# Patient Record
Sex: Female | Born: 2005 | Race: White | Hispanic: No | Smoking: Never smoker
Health system: Southern US, Community
[De-identification: ages and names within clinical notes are randomized; demographics above are authoritative.]

## PROBLEM LIST (undated history)

## (undated) DIAGNOSIS — F99 Mental disorder, not otherwise specified: Secondary | ICD-10-CM

## (undated) HISTORY — DX: Mental disorder, not otherwise specified: F99

---
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
## 2023-06-10 ENCOUNTER — Ambulatory Visit (INDEPENDENT_AMBULATORY_CARE_PROVIDER_SITE_OTHER): Payer: Medicaid Other | Admitting: Obstetrics & Gynecology

## 2023-06-10 ENCOUNTER — Encounter: Payer: Self-pay | Admitting: Obstetrics & Gynecology

## 2023-06-10 VITALS — BP 125/76 | HR 88 | Wt 133.8 lb

## 2023-06-10 DIAGNOSIS — Z3202 Encounter for pregnancy test, result negative: Secondary | ICD-10-CM

## 2023-06-10 DIAGNOSIS — Z975 Presence of (intrauterine) contraceptive device: Secondary | ICD-10-CM

## 2023-06-10 DIAGNOSIS — Z30430 Encounter for insertion of intrauterine contraceptive device: Secondary | ICD-10-CM | POA: Diagnosis not present

## 2023-06-10 LAB — POCT URINE PREGNANCY: Preg Test, Ur: NEGATIVE

## 2023-06-10 MED ORDER — LEVONORGESTREL 19.5 MG IU IUD: INTRAUTERINE_SYSTEM | Freq: Once | INTRAUTERINE | Status: AC

## 2023-06-10 NOTE — Progress Notes (Signed)
    GYNECOLOGY OFFICE PROCEDURE NOTE  Felicia Howell is a 17 y.o. G0P0000 here for IUD insertion. No GYN concerns.    Initially desires copper IUD but given history of bleeding issues, was able to counsel her and convince her to try [facility]. Also worried about mood side effects, explained this was the lowest does progestin IUD and less likely to cause mood effects.   IUD Insertion Procedure Note Patient identified, informed consent performed, consent signed.   Discussed risks of irregular bleeding, cramping, infection, malpositioning or misplacement of the IUD outside the uterus which may require further procedure such as laparoscopy. Also discussed >99% contraception efficacy, increased risk of ectopic pregnancy with failure of method.   Emphasized that this did not protect against STIs, condoms recommended during all sexual encounters. Time out was performed.  Chaperone present.  Urine pregnancy test negative.  Speculum placed in the vagina.  Cervix visualized.  Cleaned with Betadine x 2.  Paracervical block with 10 ml of 1 % lidocaine administered.  Cervix was grasped anteriorly with a single tooth tenaculum.  Uterus sounded to 7 cm.  Kyleena IUD placed per manufacturer's recommendations.  Strings trimmed to 3 cm. Tenaculum was removed, good hemostasis noted.  Patient tolerated procedure well.   Patient was given post-procedure instructions.   Patient was also asked to check IUD strings periodically and follow up in 4 weeks for IUD check.   Felicia Collins, MD, FACOG Obstetrician & Gynecologist, [facility] for [employer], [facility]

## 2023-12-28 ENCOUNTER — Ambulatory Visit: Admission: EM | Admit: 2023-12-28 | Discharge: 2023-12-28 | Disposition: A | Discharge status: Home / Self Care

## 2023-12-28 DIAGNOSIS — J029 Acute pharyngitis, unspecified: Secondary | ICD-10-CM | POA: Insufficient documentation

## 2023-12-28 LAB — POCT RAPID STREP A (OFFICE): Rapid Strep A Screen: NEGATIVE

## 2023-12-28 LAB — POCT MONO SCREEN (KUC): Mono, POC: NEGATIVE

## 2023-12-28 NOTE — Discharge Instructions (Signed)
 Strep and mono were negative.  Suspect viral cause to your sore throat.  If symptoms persist or worsen, please follow-up.

## 2023-12-28 NOTE — ED Triage Notes (Signed)
 Pt presents with sore throat x 1 day. Pt denies emesis and diarrhea.

## 2023-12-28 NOTE — ED Provider Notes (Addendum)
 [facility]    CSN: [id number] Arrival date & time: 12/28/23  0808      History   Chief Complaint Chief Complaint  Patient presents with   Sore Throat    HPI Felicia Howell is a 18 y.o. female.   Patient presents with sore throat that started yesterday.  Reports the pain has increased in severity since it started.  Denies any associated nasal congestion, runny nose, cough, fever, body aches, chills.  Denies any known sick contacts.  Denies any exposure to STD.  Is taking Tylenol for symptoms with some improvement.   Sore Throat    Past Medical History:  Diagnosis Date   Mental disorder    Bipolar, depression, anxiety    There are no active problems to display for this patient.   No past surgical history on file.  OB History     Gravida  0   Para  0   Term  0   Preterm  0   AB  0   Living  0      SAB  0   IAB  0   Ectopic  0   Multiple  0   Live Births  0            Home Medications    Prior to Admission medications   Medication Sig Start Date End Date Taking? Authorizing Provider  acetaminophen (TYLENOL) 160 MG/5ML liquid Take by mouth every 4 (four) hours as needed for fever.   Yes [provider]  amoxicillin-clavulanate (AUGMENTIN) 500-125 MG tablet SMARTSIG:1 Tablet(s) By Mouth 11/03/23  Yes [provider]    Family History Family History  Family history unknown: Yes    Social History Social History   Tobacco Use   Smoking status: Never   Smokeless tobacco: Never  Vaping Use   Vaping status: Never Used  Substance Use Topics   Alcohol use: Not Currently   Drug use: Not Currently     Allergies   Patient has no known allergies.   Review of Systems Review of Systems Per HPI  Physical Exam Triage Vital Signs ED Triage Vitals  Encounter Vitals Group     BP 12/28/23 0831 121/74     Systolic BP Percentile --      Diastolic BP Percentile --      Pulse Rate 12/28/23 0831 (!) 110      Resp 12/28/23 0831 18     Temp 12/28/23 0831 98.6 F (37 C)     Temp Source 12/28/23 0831 Oral     SpO2 12/28/23 0831 98 %     Weight --      Height --      Head Circumference --      Peak Flow --      Pain Score 12/28/23 0827 7     Pain Loc --      Pain Education --      Exclude from Growth Chart --    No data found.  Updated Vital Signs BP 121/74 (BP Location: Left Arm)   Pulse (!) 110   Temp 98.6 F (37 C) (Oral)   Resp 18   LMP 12/17/2023   SpO2 98%   Visual Acuity Right Eye Distance:   Left Eye Distance:   Bilateral Distance:    Right Eye Near:   Left Eye Near:    Bilateral Near:     Physical Exam Constitutional:      General: She is not in  acute distress.    Appearance: Normal appearance. She is not toxic-appearing or diaphoretic.  HENT:     Head: Normocephalic and atraumatic.     Right Ear: Tympanic membrane and ear canal normal.     Left Ear: Tympanic membrane and ear canal normal.     Nose: Nose normal.     Mouth/Throat:     Mouth: Mucous membranes are moist.     Pharynx: Posterior oropharyngeal erythema present. No pharyngeal swelling or oropharyngeal exudate.     Tonsils: No tonsillar exudate or tonsillar abscesses.  Eyes:     Extraocular Movements: Extraocular movements intact.     Conjunctiva/sclera: Conjunctivae normal.  Cardiovascular:     Rate and Rhythm: Normal rate and regular rhythm.     Pulses: Normal pulses.     Heart sounds: Normal heart sounds.  Pulmonary:     Effort: Pulmonary effort is normal. No respiratory distress.     Breath sounds: Normal breath sounds.  Neurological:     General: No focal deficit present.     Mental Status: She is alert and oriented to person, place, and time. Mental status is at baseline.  Psychiatric:        Mood and Affect: Mood normal.        Behavior: Behavior normal.        Thought Content: Thought content normal.        Judgment: Judgment normal.      UC Treatments / Results  Labs (all labs  ordered are listed, but only abnormal results are displayed) Labs Reviewed  POCT RAPID STREP A (OFFICE) - Normal  POCT MONO SCREEN ([facility]) - Normal  CULTURE, GROUP A STREP [facility])    EKG   Radiology No results found.  Procedures Procedures (including critical care time)  Medications Ordered in UC Medications - No data to display  Initial Impression / Assessment and Plan / UC Course  I have reviewed the triage vital signs and the nursing notes.  Pertinent labs & imaging results that were available during my care of the patient were reviewed by me and considered in my medical decision making (see chart for details).     Rapid strep and rapid mono were negative.  Throat culture pending.  Patient declined COVID testing.  Suspect viral pharyngitis.  Patient declined any exposure to STD from oral intercourse so this testing was deferred as well.  No signs of peritonsillar abscess on exam.  Heart rate normal on exam. Advised supportive care, symptom management, fluids.  Advised strict follow-up precautions.  Patient verbalized understanding and was agreeable with plan. Final Clinical Impressions(s) / UC Diagnoses   Final diagnoses:  Acute pharyngitis, unspecified etiology     Discharge Instructions      Strep and mono were negative.  Suspect viral cause to your sore throat.  If symptoms persist or worsen, please follow-up.     ED Prescriptions   None    PDMP not reviewed this encounter.   Gustavus Bryant, [place] 12/28/23 0935    Gustavus Bryant, [place] 12/28/23 [phone]

## 2023-12-31 LAB — CULTURE, GROUP A STREP (THRC)

## 2024-02-10 ENCOUNTER — Encounter: Payer: Self-pay | Admitting: Family Medicine

## 2024-02-10 ENCOUNTER — Ambulatory Visit (INDEPENDENT_AMBULATORY_CARE_PROVIDER_SITE_OTHER): Admitting: Family Medicine

## 2024-02-10 VITALS — BP 115/74 | HR 94 | Temp 98.0°F | Resp 18 | Ht 62.0 in | Wt 145.1 lb

## 2024-02-10 DIAGNOSIS — F39 Unspecified mood [affective] disorder: Secondary | ICD-10-CM | POA: Diagnosis not present

## 2024-02-10 DIAGNOSIS — Z7689 Persons encountering health services in other specified circumstances: Secondary | ICD-10-CM | POA: Diagnosis not present

## 2024-02-10 DIAGNOSIS — L678 Other hair color and hair shaft abnormalities: Secondary | ICD-10-CM

## 2024-02-10 NOTE — Progress Notes (Signed)
 New Patient Office Visit  Subjective    Patient ID: Felicia Howell, female    DOB: 2006/07/26  Age: 18 y.o. MRN: [id number]  CC:  Chief Complaint  Patient presents with   Establish Care    Patient is here to establish care with a new PCP, Patient would like to discuss hormones due to hair growth on her chin , that has increased within the last 5 years. Patient also states that her menstrual cycles are horrible, she does have an IUD she states that it helps but its still bad with cramping.  She would also like to discuss anxiety that she has been having for years since she was a teenager.    HPI Felicia Howell presents to establish care. Pt is new to me  She would like to get labs done to check her hormones due to facial hair growth for the last several years. They are coming in more. She reports her aunt had PCOS and was worried about this. She also reports her menstrual cycle was heavy but she got an IUD which has helped her bleeding but not with cramps. She has the Kyleena  which has helped. She still has bad cramps.   She also reports she wakes up anxious. As soon as she wakes up, she feels like she's drank a cup of coffee. She reports a hx of anxiety/mental health issues since her teenage years. She had therapist/psychiatrist that diagnosed her with bipolar but the pt reports she doesn't think it's this. She thinks it's just anxiety. She remembers being on Rexulti, Trazodone, Lamictal, Sertraline, Lunesta, Belsomra. Pt was seeing [place] [facility] for mental health. Pt also feels like she has ADHD. She says she has mood swings and irritability.  Flowsheet Row Office Visit from 02/10/2024 in [facility]  PHQ-9 Total Score 14          02/10/2024    9:39 AM  GAD 7 : Generalized Anxiety Score  Nervous, Anxious, on Edge 3  Control/stop worrying 3  Worry too much - different things 3  Trouble relaxing 3  Restless 2  Easily annoyed or irritable 3  Afraid -  awful might happen 3  Total GAD 7 Score 20  Anxiety Difficulty Somewhat difficult     Outpatient Encounter Medications as of 02/10/2024  Medication Sig   [DISCONTINUED] acetaminophen (TYLENOL) 160 MG/5ML liquid Take by mouth every 4 (four) hours as needed for fever.   [DISCONTINUED] amoxicillin -clavulanate (AUGMENTIN) 500-125 MG tablet SMARTSIG:1 Tablet(s) By Mouth   No facility-administered encounter medications on file as of 02/10/2024.    Past Medical History:  Diagnosis Date   Mental disorder    Bipolar, depression, anxiety    History reviewed. No pertinent surgical history.  Family History  Problem Relation Age of Onset   Healthy Mother    Healthy Father     Social History   Socioeconomic History   Marital status: Significant Other    Spouse name: Not on file   Number of children: Not on file   Years of education: Not on file   Highest education level: Not on file  Occupational History   Not on file  Tobacco Use   Smoking status: Never    Passive exposure: Never   Smokeless tobacco: Never  Vaping Use   Vaping status: Never Used  Substance and Sexual Activity   Alcohol use: Never   Drug use: Never   Sexual activity: Yes    Birth control/protection: I.U.D.  Other Topics Concern   Not on file  Social History Narrative   Not on file   Social Drivers of Health   Financial Resource Strain: Not on file  Food Insecurity: Not on file  Transportation Needs: Not on file  Physical Activity: Not on file  Stress: Not on file  Social Connections: Not on file  Intimate Partner Violence: Not on file    Review of Systems  Endo/Heme/Allergies:        Facial hair growth  Psychiatric/Behavioral:  Negative for depression. The patient is nervous/anxious and has insomnia.        Mood swings  All other systems reviewed and are negative.       Objective    BP 115/74   Pulse 94   Temp 98 F (36.7 C) (Oral)   Resp 18   Ht 5\' 2"  (1.575 m)   Wt 145 lb 1.6 oz  (65.8 kg)   LMP 02/10/2024 (Exact Date)   SpO2 99%   BMI 26.54 kg/m   Physical Exam Vitals and nursing note reviewed.  Constitutional:      Appearance: Normal appearance. She is normal weight.  HENT:     Head: Normocephalic and atraumatic.     Right Ear: External ear normal.     Left Ear: External ear normal.     Nose: Nose normal.     Mouth/Throat:     Mouth: Mucous membranes are moist.     Pharynx: Oropharynx is clear.  Eyes:     Conjunctiva/sclera: Conjunctivae normal.     Pupils: Pupils are equal, round, and reactive to light.  Cardiovascular:     Rate and Rhythm: Normal rate and regular rhythm.     Pulses: Normal pulses.     Heart sounds: Normal heart sounds.  Pulmonary:     Effort: Pulmonary effort is normal.     Breath sounds: Normal breath sounds.  Skin:    General: Skin is warm.     Capillary Refill: Capillary refill takes less than 2 seconds.  Neurological:     General: No focal deficit present.     Mental Status: She is alert and oriented to person, place, and time. Mental status is at baseline.  Psychiatric:        Mood and Affect: Mood normal.        Behavior: Behavior normal.        Thought Content: Thought content normal.        Judgment: Judgment normal.      Assessment & Plan:   Problem List Items Addressed This Visit   None Encounter to establish care with new doctor  Abnormal facial hair -     Estrogens, total -     Progesterone -     TSH + free T4 -     DHEA-sulfate -     Cortisol  Mood disorder (HCC) -     Ambulatory referral to Behavioral Health   Pt with abnormal facial hair. Check hormone panel. Due to hx of mood disorder, bipolar disorder that pt doesn't agree with. To refer to [facility] for further evaluation, diagnosis, and management.   No follow-ups on file.   Manette Section, MD

## 2024-02-12 LAB — CORTISOL: Cortisol: 5.1 ug/dL — ABNORMAL LOW (ref 6.2–19.4)

## 2024-02-12 LAB — DHEA-SULFATE: DHEA-SO4: 353 ug/dL (ref 110.0–431.7)

## 2024-02-12 LAB — ESTROGENS, TOTAL: Estrogen: 81 pg/mL

## 2024-02-12 LAB — TSH+FREE T4
Free T4: 1.31 ng/dL (ref 0.82–1.77)
TSH: 0.521 u[IU]/mL (ref 0.450–4.500)

## 2024-02-12 LAB — PROGESTERONE: Progesterone: 0.3 ng/mL

## 2024-02-15 ENCOUNTER — Ambulatory Visit: Payer: Self-pay | Admitting: Family Medicine

## 2024-05-12 ENCOUNTER — Ambulatory Visit: Admitting: Family Medicine
